# Patient Record
Sex: Male | Born: 1970
Health system: Southern US, Community
[De-identification: ages and names within clinical notes are randomized; demographics above are authoritative.]

## PROBLEM LIST (undated history)

## (undated) HISTORY — PX: HERNIA REPAIR: SHX51

---

## 2010-03-06 ENCOUNTER — Emergency Department: Payer: Self-pay | Admitting: Emergency Medicine

## 2011-07-07 ENCOUNTER — Emergency Department: Payer: Self-pay | Admitting: Emergency Medicine

## 2011-07-07 LAB — CBC
HGB: 15.2 g/dL (ref 13.0–18.0)
MCH: 30.1 pg (ref 26.0–34.0)
MCHC: 35.1 g/dL (ref 32.0–36.0)
MCV: 86 fL (ref 80–100)
Platelet: 230 10*3/uL (ref 150–440)
RBC: 5.05 10*6/uL (ref 4.40–5.90)
WBC: 7.8 10*3/uL (ref 3.8–10.6)

## 2011-07-07 LAB — COMPREHENSIVE METABOLIC PANEL
Albumin: 4.2 g/dL (ref 3.4–5.0)
Anion Gap: 12 (ref 7–16)
Calcium, Total: 8.9 mg/dL (ref 8.5–10.1)
Chloride: 104 mmol/L (ref 98–107)
Creatinine: 0.87 mg/dL (ref 0.60–1.30)
EGFR (African American): >60
Glucose: 123 mg/dL — ABNORMAL HIGH (ref 65–99)
Potassium: 3.5 mmol/L (ref 3.5–5.1)
SGOT(AST): 22 U/L (ref 15–37)

## 2011-07-07 LAB — PROTIME-INR: INR: 0.9

## 2012-11-15 ENCOUNTER — Emergency Department: Payer: Self-pay | Admitting: Internal Medicine

## 2012-11-15 LAB — URINALYSIS, COMPLETE
Bacteria: NONE SEEN
Ketone: NEGATIVE
RBC,UR: 156 /HPF (ref 0–5)
Specific Gravity: 1.019 (ref 1.003–1.030)
Squamous Epithelial: <1
WBC UR: 9 /HPF (ref 0–5)

## 2012-11-15 LAB — COMPREHENSIVE METABOLIC PANEL
Albumin: 4.1 g/dL (ref 3.4–5.0)
Anion Gap: 7 (ref 7–16)
BUN: 16 mg/dL (ref 7–18)
Calcium, Total: 9.1 mg/dL (ref 8.5–10.1)
Chloride: 104 mmol/L (ref 98–107)
Co2: 27 mmol/L (ref 21–32)
Creatinine: 0.95 mg/dL (ref 0.60–1.30)
EGFR (African American): >60
EGFR (Non-African Amer.): >60
Glucose: 101 mg/dL — ABNORMAL HIGH (ref 65–99)
Osmolality: 277 (ref 275–301)
Potassium: 3.5 mmol/L (ref 3.5–5.1)
SGPT (ALT): 25 U/L (ref 12–78)
Sodium: 138 mmol/L (ref 136–145)
Total Protein: 7.6 g/dL (ref 6.4–8.2)

## 2012-11-15 LAB — CBC
HCT: 45.4 % (ref 40.0–52.0)
HGB: 16.2 g/dL (ref 13.0–18.0)
MCH: 29.9 pg (ref 26.0–34.0)
MCV: 84 fL (ref 80–100)
Platelet: 230 10*3/uL (ref 150–440)
RBC: 5.42 10*6/uL (ref 4.40–5.90)

## 2013-10-22 IMAGING — CT CT STONE STUDY
1 of 2 series · 16 of 32 positions shown, 20 images · non-contrast
Comparison: none

REASON FOR EXAM: flank pain l
COMMENTS:

PROCEDURE:     CT  - CT ABDOMEN /PELVIS WO (STONE)  - November 15, 2012  [DATE]
RESULT:     History: Flank pain.
Comparison Study: No prior.

[Series 2: 3mm soft tissue · axial · 0.68mm/px · z∈[-906,-450]mm · 16 of 166 slices shown, 20 images]
[im 7/166  soft-tissue]
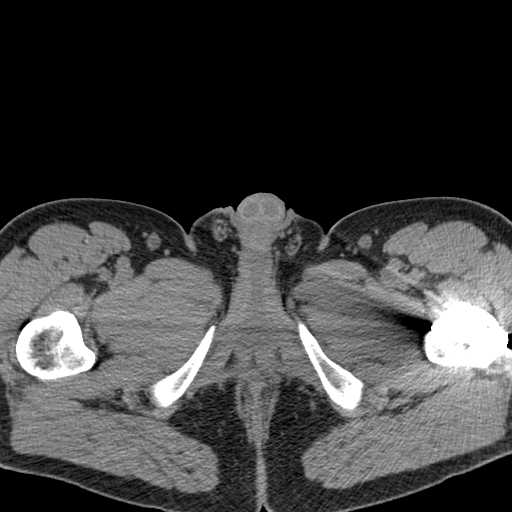
[im 7/166  bone]
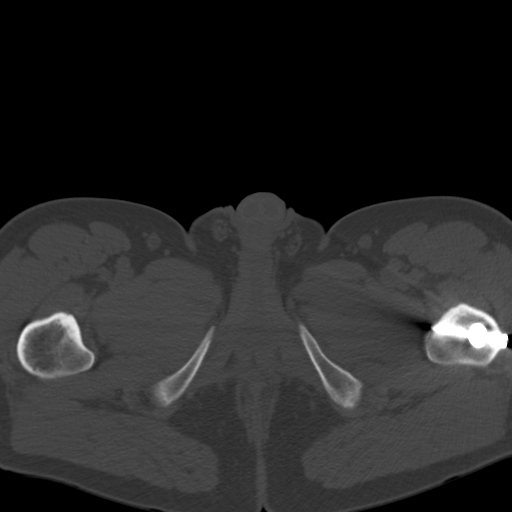
[im 20/166  soft-tissue]
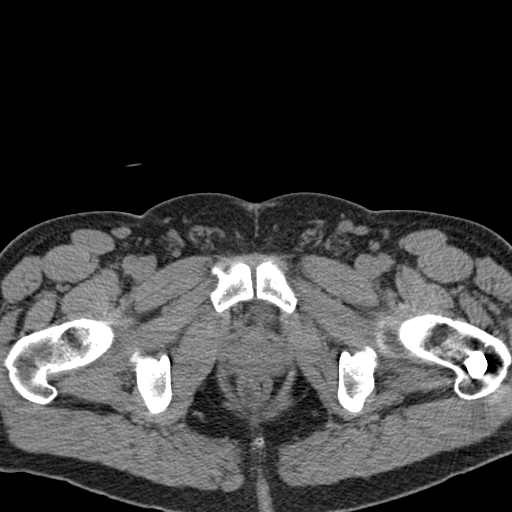
[im 34/166  soft-tissue]
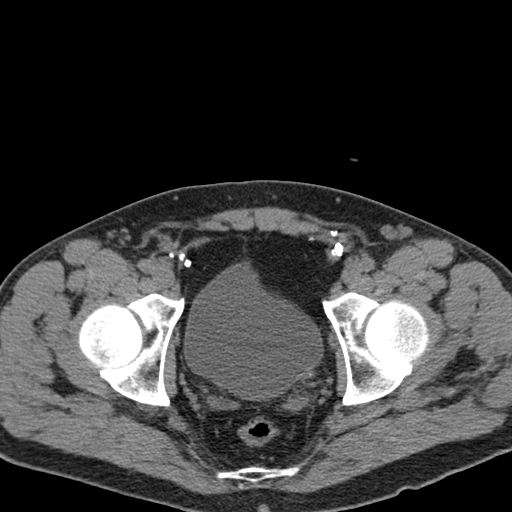
[im 47/166  soft-tissue]
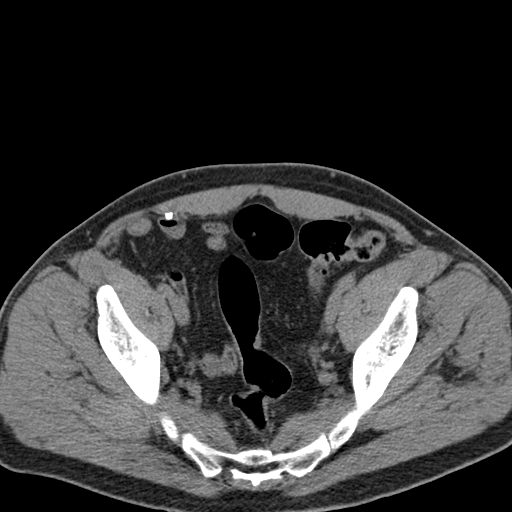
[im 53/166  soft-tissue]
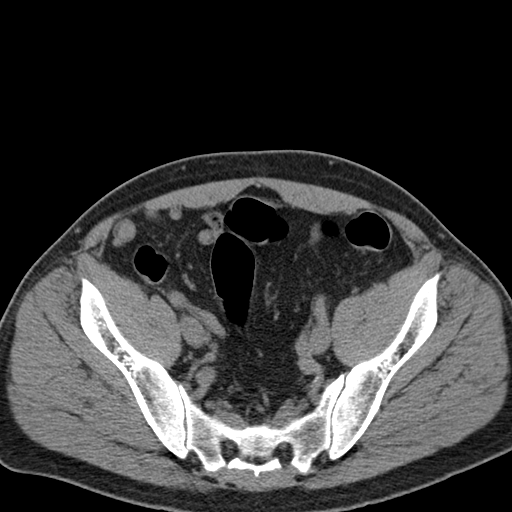
[im 67/166  soft-tissue]
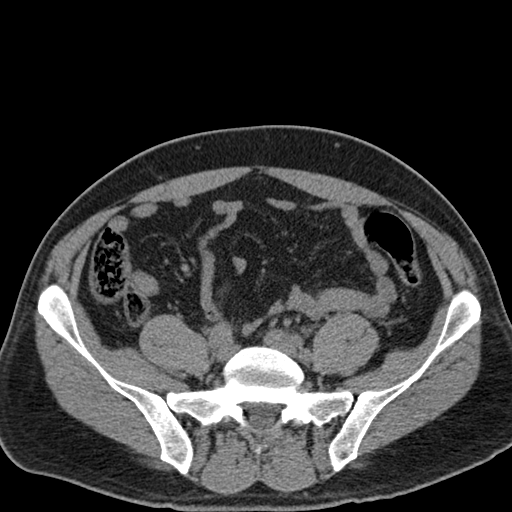
[im 80/166  soft-tissue]
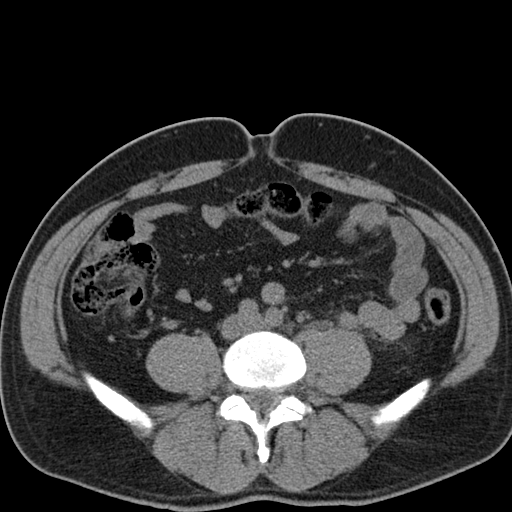
[im 86/166  soft-tissue]
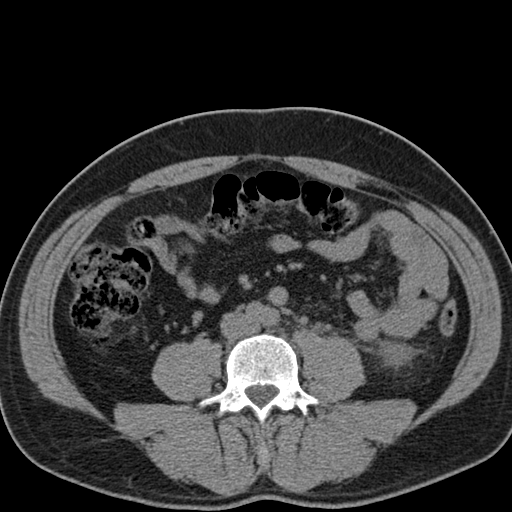
[im 100/166  soft-tissue]
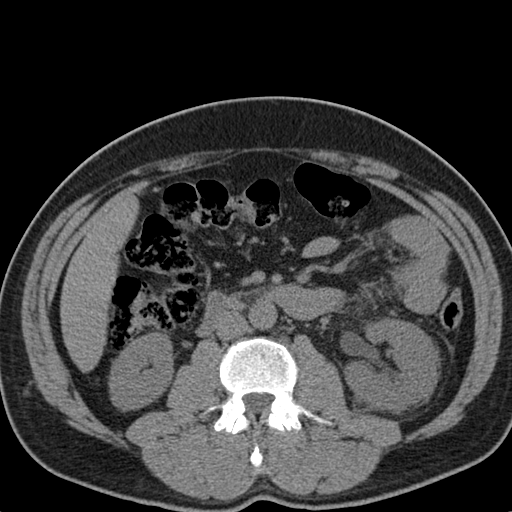
[im 100/166  bone]
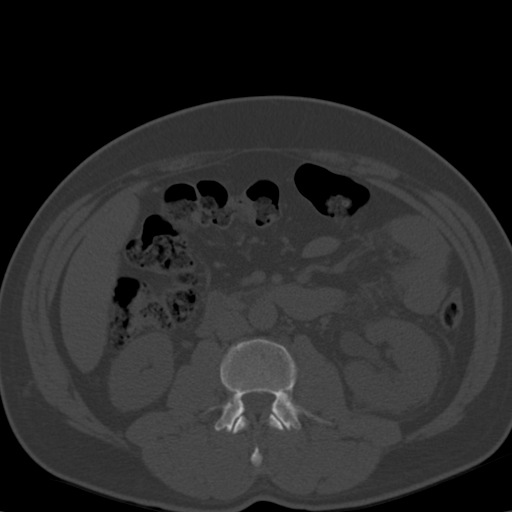
[im 113/166  soft-tissue]
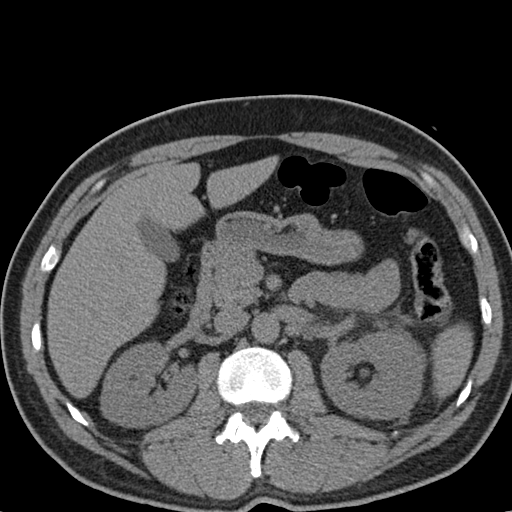
[im 126/166  soft-tissue]
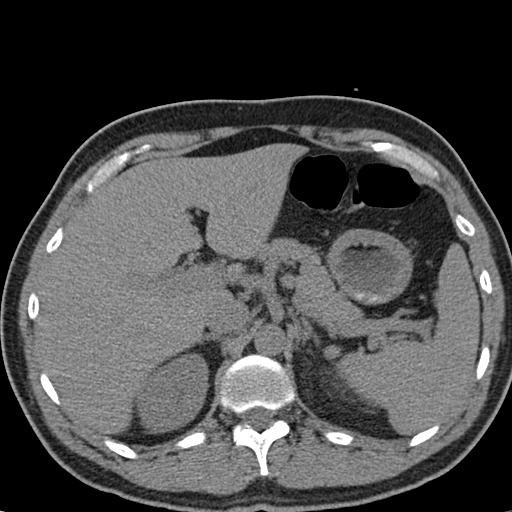
[im 133/166  soft-tissue]
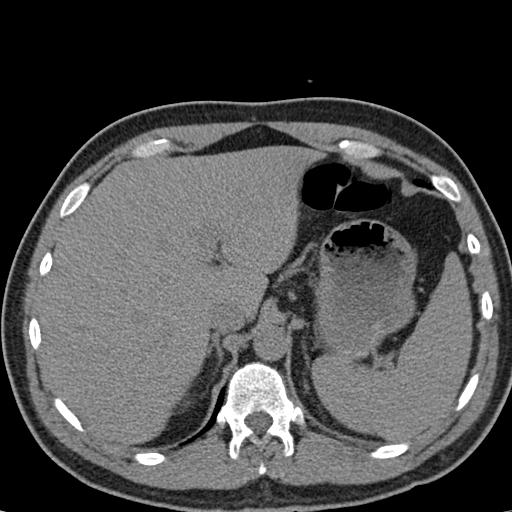
[im 139/166  lung]
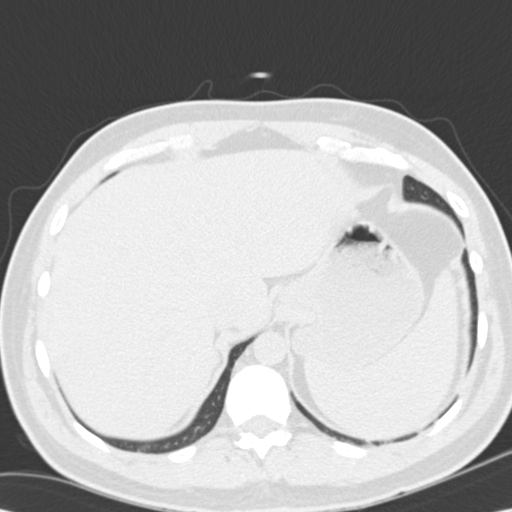
[im 146/166  soft-tissue]
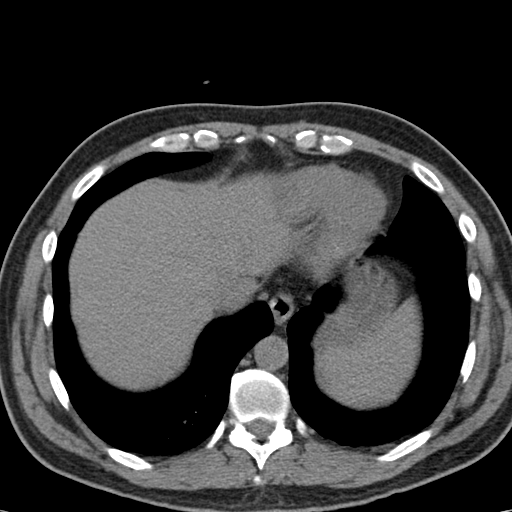
[im 146/166  lung]
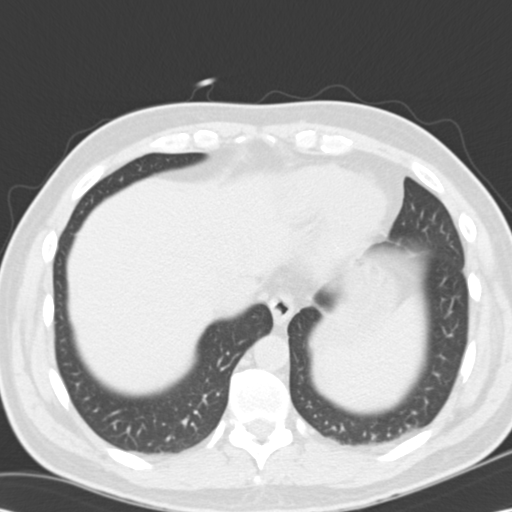
[im 152/166  lung]
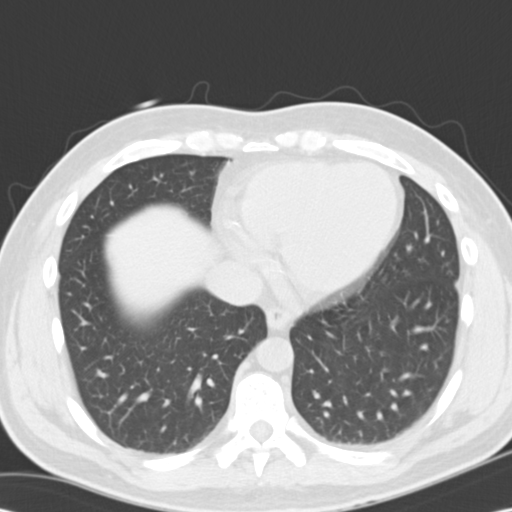
[im 159/166  soft-tissue]
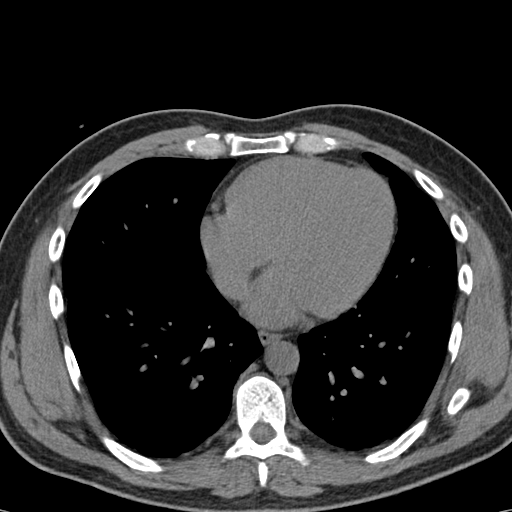
[im 159/166  lung]
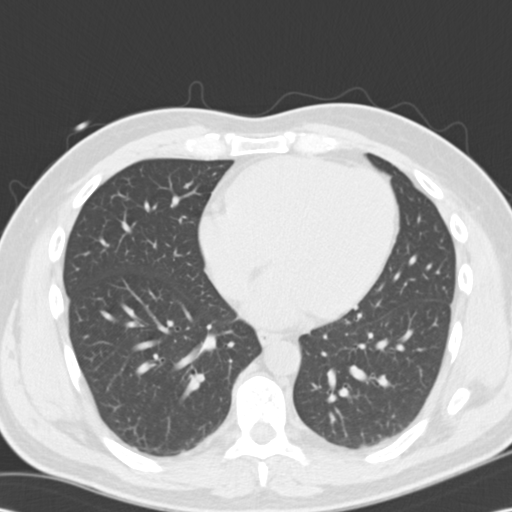

[16 of 32 positions shown; findings below may reference images not displayed]

FINDINGS: Standard nonenhanced CT obtained. Lung bases clear. Simple cyst
left lobe of liver. Gallbladder is nondistended. No biliary ductal
distention. Pancreas is normal. Spleen is normal. Adrenals are normal. Right
kidney and ureter are normal. 2-3 mm stone is present in the distal left
ureter with mild left hydronephrosis and hydroureter with mild left
perirenal streaking. Left nephrolithiasis present. Bladder is nondistended.
No pelvic mass. No pelvic fluid collections. No bowel distention. Appendix
normal. Prior hernia repair. No free air. Postsurgical changes left hip.
IMPRESSION: 2 to 3 mm stone in the distal left ureter at the
ureterovesical junction with mild hydronephrosis. Left nephrolithiasis.

## 2015-06-21 ENCOUNTER — Emergency Department
Admission: EM | Admit: 2015-06-21 | Discharge: 2015-06-21 | Disposition: A | Discharge status: Home / Self Care | Payer: 59 | Attending: Emergency Medicine | Admitting: Emergency Medicine

## 2015-06-21 ENCOUNTER — Emergency Department: Payer: 59

## 2015-06-21 DIAGNOSIS — G43809 Other migraine, not intractable, without status migrainosus: Secondary | ICD-10-CM | POA: Diagnosis not present

## 2015-06-21 DIAGNOSIS — R51 Headache: Secondary | ICD-10-CM | POA: Diagnosis present

## 2015-06-21 LAB — COMPREHENSIVE METABOLIC PANEL
ALBUMIN: 4.4 g/dL (ref 3.5–5.0)
ALT: 33 U/L (ref 17–63)
ANION GAP: 7 (ref 5–15)
AST: 22 U/L (ref 15–41)
Alkaline Phosphatase: 73 U/L (ref 38–126)
BILIRUBIN TOTAL: 1.1 mg/dL (ref 0.3–1.2)
BUN: 13 mg/dL (ref 6–20)
CALCIUM: 9.4 mg/dL (ref 8.9–10.3)
CO2: 27 mmol/L (ref 22–32)
Chloride: 103 mmol/L (ref 101–111)
Creatinine, Ser: 0.6 mg/dL — ABNORMAL LOW (ref 0.61–1.24)
GFR calc non Af Amer: >60 mL/min (ref >60)
GLUCOSE: 101 mg/dL — AB (ref 65–99)
POTASSIUM: 3.9 mmol/L (ref 3.5–5.1)
SODIUM: 137 mmol/L (ref 135–145)
TOTAL PROTEIN: 7.7 g/dL (ref 6.5–8.1)

## 2015-06-21 LAB — CBC WITH DIFFERENTIAL/PLATELET
BASOS ABS: 0 10*3/uL (ref 0–0.1)
Eosinophils Absolute: 0.1 10*3/uL (ref 0–0.7)
Eosinophils Relative: 2 %
HEMATOCRIT: 47 % (ref 40.0–52.0)
HEMOGLOBIN: 16.5 g/dL (ref 13.0–18.0)
Lymphs Abs: 1.3 10*3/uL (ref 1.0–3.6)
MCH: 29.4 pg (ref 26.0–34.0)
MCHC: 35.2 g/dL (ref 32.0–36.0)
MCV: 83.8 fL (ref 80.0–100.0)
MONO ABS: 0.9 10*3/uL (ref 0.2–1.0)
Monocytes Relative: 14 %
NEUTROS ABS: 4.1 10*3/uL (ref 1.4–6.5)
Platelets: 207 10*3/uL (ref 150–440)
RBC: 5.61 MIL/uL (ref 4.40–5.90)
RDW: 13.6 % (ref 11.5–14.5)
WBC: 6.4 10*3/uL (ref 3.8–10.6)

## 2015-06-21 MED ORDER — KETOROLAC TROMETHAMINE 30 MG/ML IJ SOLN
30.0000 mg | Freq: Once | INTRAMUSCULAR | Status: AC
  Administered 2015-06-21: 30 mg via INTRAVENOUS

## 2015-06-21 MED ORDER — PROCHLORPERAZINE EDISYLATE 5 MG/ML IJ SOLN
10.0000 mg | Freq: Four times a day (QID) | INTRAMUSCULAR | Status: DC | PRN
  Administered 2015-06-21: 10 mg via INTRAVENOUS
  Filled 2015-06-21: qty 2

## 2015-06-21 MED ORDER — KETOROLAC TROMETHAMINE 30 MG/ML IJ SOLN
INTRAMUSCULAR | Status: AC
  Filled 2015-06-21: qty 1

## 2015-06-21 MED ORDER — SODIUM CHLORIDE 0.9 % IV SOLN
Freq: Once | INTRAVENOUS | Status: AC
  Administered 2015-06-21: 12:00:00 via INTRAVENOUS

## 2015-06-21 NOTE — ED Notes (Signed)
Pt c/o HA since Tuesday with dizziness and nausea.. States he has had HA in the past but never this severe..Marland Kitchen

## 2015-06-21 NOTE — ED Notes (Signed)
Pt states has a history of migraine headaches. Denies visual changes. Pt reports nausea. Pt states headache is constant but comes and goes in different locations on his head.

## 2015-06-21 NOTE — Discharge Instructions (Signed)
Migraine Headache A migraine headache is an intense, throbbing pain on one or both sides of your head. A migraine can last for 30 minutes to several hours. CAUSES  The exact cause of a migraine headache is not always known. However, a migraine may be caused when nerves in the brain become irritated and release chemicals that cause inflammation. This causes pain. Certain things may also trigger migraines, such as:  Alcohol.  Smoking.  Stress.  Menstruation.  Aged cheeses.  Foods or drinks that contain nitrates, glutamate, aspartame, or tyramine.  Lack of sleep.  Chocolate.  Caffeine.  Hunger.  Physical exertion.  Fatigue.  Medicines used to treat chest pain (nitroglycerine), birth control pills, estrogen, and some blood pressure medicines. SIGNS AND SYMPTOMS  Pain on one or both sides of your head.  Pulsating or throbbing pain.  Severe pain that prevents daily activities.  Pain that is aggravated by any physical activity.  Nausea, vomiting, or both.  Dizziness.  Pain with exposure to bright lights, loud noises, or activity.  General sensitivity to bright lights, loud noises, or smells. Before you get a migraine, you may get warning signs that a migraine is coming (aura). An aura may include:  Seeing flashing lights.  Seeing bright spots, halos, or zigzag lines.  Having tunnel vision or blurred vision.  Having feelings of numbness or tingling.  Having trouble talking.  Having muscle weakness. DIAGNOSIS  A migraine headache is often diagnosed based on:  Symptoms.  Physical exam.  A CT scan or MRI of your head. These imaging tests cannot diagnose migraines, but they can help rule out other causes of headaches. TREATMENT Medicines may be given for pain and nausea. Medicines can also be given to help prevent recurrent migraines.  HOME CARE INSTRUCTIONS  Only take over-the-counter or prescription medicines for pain or discomfort as directed by your  health care provider. The use of long-term narcotics is not recommended.  Lie down in a dark, quiet room when you have a migraine.  Keep a journal to find out what may trigger your migraine headaches. For example, write down:  What you eat and drink.  How much sleep you get.  Any change to your diet or medicines.  Limit alcohol consumption.  Quit smoking if you smoke.  Get 7-9 hours of sleep, or as recommended by your health care provider.  Limit stress.  Keep lights dim if bright lights bother you and make your migraines worse. SEEK IMMEDIATE MEDICAL CARE IF:   Your migraine becomes severe.  You have a fever.  You have a stiff neck.  You have vision loss.  You have muscular weakness or loss of muscle control.  You start losing your balance or have trouble walking.  You feel faint or pass out.  You have severe symptoms that are different from your first symptoms. MAKE SURE YOU:   Understand these instructions.  Will watch your condition.  Will get help right away if you are not doing well or get worse.   This information is not intended to replace advice given to you by your health care provider. Make sure you discuss any questions you have with your health care provider.   Document Released: 06/09/2005 Document Revised: 06/30/2014 Document Reviewed: 02/14/2013 Elsevier Interactive Patient Education 2016 [employer].  Rest and take it easy today. Drink plenty of fluids. If the headache starts to come back take some high-strength Motrin 800 mg. This is the same as for the over-the-counter pills. And take  3 regular Tylenols and drink a can of Coca-Cola or a vague cup of coffee. That does not work please return again. Please return also for fever vomiting or feeling sicker.

## 2015-06-21 NOTE — ED Provider Notes (Signed)
[name] Emergency Department Provider Note  ____________________________________________  Time seen: Approximately 11:17 AM  I have reviewed the triage vital signs and the nursing notes.   HISTORY  Chief Complaint Headache    HPI Brian OchsMichael T Acosta is a 44 y.o. male patient reported he developed some abdominal pain 2 days ago. This then turned into a headache. He's had a headache ever since. Throbbing, diffuse in nature he moves his head to lay down and it'll hurt where he lays his head down his head in a different position and hurt there to after a while. Patient is nauseated but has no vomiting. Patient denies any fever. Patient does not have neck stiffness. Patient reports he's had migraines in the past but none for about 2 years. This headache feels like his migraine but is more intense and also has lasted longer.   History reviewed. No pertinent past medical history.  There are no active problems to display for this patient.   Past Surgical History  Procedure Laterality Date  . Hernia repair      No current outpatient prescriptions on file.  Allergies Review of patient's allergies indicates no known allergies.  No family history on file.  Social History Social History  Substance Use Topics  . Smoking status: Never Smoker   . Smokeless tobacco: None  . Alcohol Use: No    Review of Systems onstitutional: No fever/chills Eyes: No visual changes. ENT: No sore throat. Cardiovascular: Denies chest pain. Respiratory: Denies shortness of breath. Gastrointestinal: No abdominal pain.   nausea, no vomiting.  No diarrhea.  No constipation. Genitourinary: Negative for dysuria. Musculoskeletal: Negative for back pain. Skin: Negative for rash. Neurological: Negative for focal weakness or numbness.  10-point ROS otherwise negative.  ____________________________________________   PHYSICAL EXAM:  VITAL SIGNS: ED Triage Vitals  Enc Vitals  Group     BP 06/21/15 1021 143/90 mmHg     Pulse Rate 06/21/15 1021 68     Resp 06/21/15 1021 18     Temp 06/21/15 1021 98.2 F (36.8 C)     Temp Source 06/21/15 1021 Oral     SpO2 06/21/15 1021 96 %     Weight 06/21/15 1021 220 lb (99.791 kg)     Height 06/21/15 1021 6' (1.829 m)     Head Cir --      Peak Flow --      Pain Score 06/21/15 1021 8     Pain Loc --      Pain Edu? --      Excl. in GC? --     Constitutional: Alert and oriented. Looks uncomfortable Eyes: Conjunctivae are normal. PERRL. EOMI. fundi appear to be normal Head: Atraumatic. Nose: No congestion/rhinnorhea. Mouth/Throat: Mucous membranes are moist.  Oropharynx non-erythematous. Neck: No stridor.   Cardiovascular: Normal rate, regular rhythm. Grossly normal heart sounds.  Good peripheral circulation. Respiratory: Normal respiratory effort.  No retractions. Lungs CTAB. Gastrointestinal: Soft and nontender. No distention. No abdominal bruits. No CVA tenderness. Musculoskeletal: No lower extremity tenderness nor edema.  No joint effusions. Neurologic:  Normal speech and language. No gross focal neurologic deficits are appreciated. Cranial nerves II through XII are intact. Finger-nose rapid alternating movements and hands are normal motor strength is 5 over 5 throughout there are no sensory changes. Skin:  Skin is warm, dry and intact. No rash noted. Psychiatric: Mood and affect are normal. Speech and behavior are normal.  ____________________________________________   LABS (all labs ordered are listed, but only  abnormal results are displayed)  Labs Reviewed  COMPREHENSIVE METABOLIC PANEL - Abnormal; Notable for the following:    Glucose, Bld 101 (*)    Creatinine, Ser 0.60 (*)    All other components within normal limits  CBC WITH DIFFERENTIAL/PLATELET   ____________________________________________  EKG   ____________________________________________  RADIOLOGY  CT read by radiology as no acute  disease ____________________________________________   PROCEDURES   Patient will be discharged as he has improved ____________________________________________   INITIAL IMPRESSION / ASSESSMENT AND PLAN / ED COURSE  Pertinent labs & imaging results that were available during my care of the patient were reviewed by me and considered in my medical decision making (see chart for details).   ____________________________________________   FINAL CLINICAL IMPRESSION(S) / ED DIAGNOSES  Final diagnoses:  Other type of migraine      [name], MD 06/21/15 1347

## 2015-08-22 DIAGNOSIS — E7800 Pure hypercholesterolemia, unspecified: Secondary | ICD-10-CM | POA: Diagnosis not present

## 2015-08-22 DIAGNOSIS — I10 Essential (primary) hypertension: Secondary | ICD-10-CM | POA: Diagnosis not present

## 2015-08-22 DIAGNOSIS — R6882 Decreased libido: Secondary | ICD-10-CM | POA: Diagnosis not present

## 2015-08-22 DIAGNOSIS — Z6829 Body mass index (BMI) 29.0-29.9, adult: Secondary | ICD-10-CM | POA: Diagnosis not present

## 2015-08-22 DIAGNOSIS — N529 Male erectile dysfunction, unspecified: Secondary | ICD-10-CM | POA: Diagnosis not present

## 2015-08-22 DIAGNOSIS — R6889 Other general symptoms and signs: Secondary | ICD-10-CM | POA: Diagnosis not present

## 2015-08-22 DIAGNOSIS — Z0000 Encounter for general adult medical examination without abnormal findings: Secondary | ICD-10-CM | POA: Diagnosis not present

## 2015-09-25 DIAGNOSIS — E291 Testicular hypofunction: Secondary | ICD-10-CM | POA: Diagnosis not present

## 2015-09-25 DIAGNOSIS — Z23 Encounter for immunization: Secondary | ICD-10-CM | POA: Diagnosis not present

## 2016-05-27 IMAGING — CT CT HEAD W/O CM
1 series · 16 of 30 positions shown, 20 images · non-contrast
Comparison: No priors.

CLINICAL DATA: 44-year-old male with history of severe headache,
dizziness and nausea for the past 3 days.

EXAM:
CT HEAD WITHOUT CONTRAST
TECHNIQUE: Contiguous axial images were obtained from the base of the skull
through the vertex without intravenous contrast.

[Series 2: head wo · axial · 0.47mm/px · z∈[+299,+456]mm · 16 of 36 slices shown, 20 images]
[im 2/36  brain]
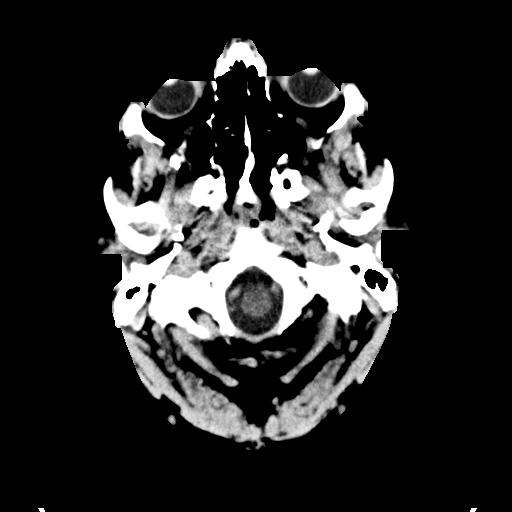
[im 2/36  bone]
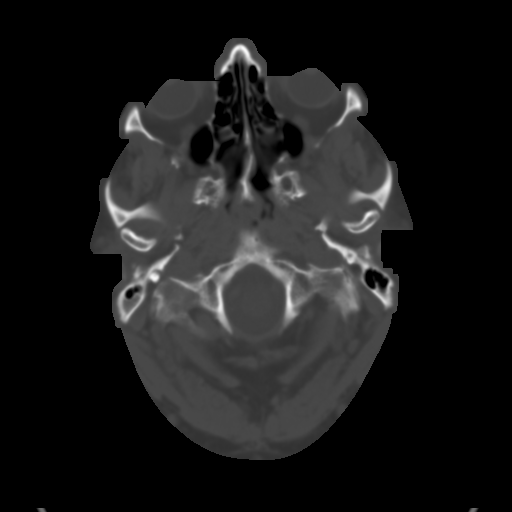
[im 4/36  brain]
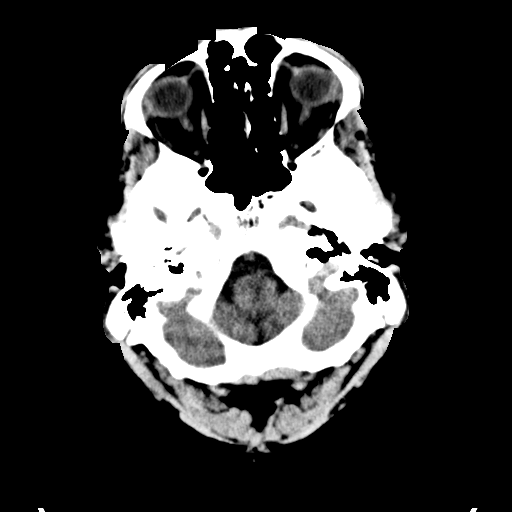
[im 7/36  brain]
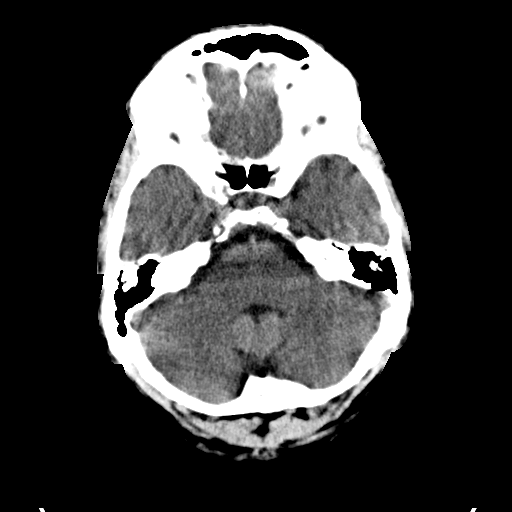
[im 9/36  brain]
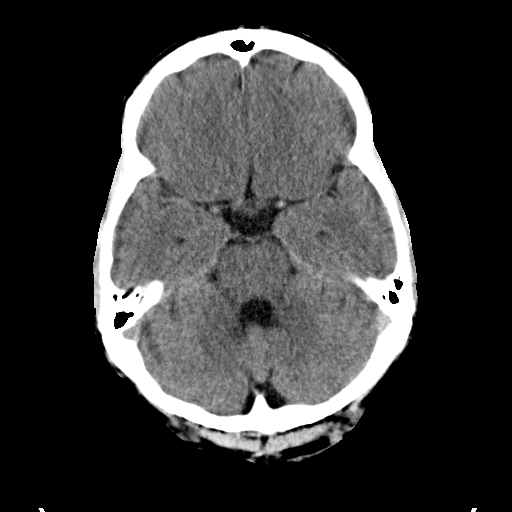
[im 10/36  brain]
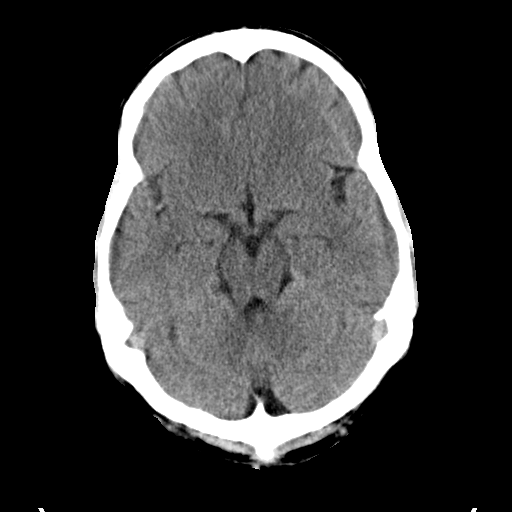
[im 10/36  bone]
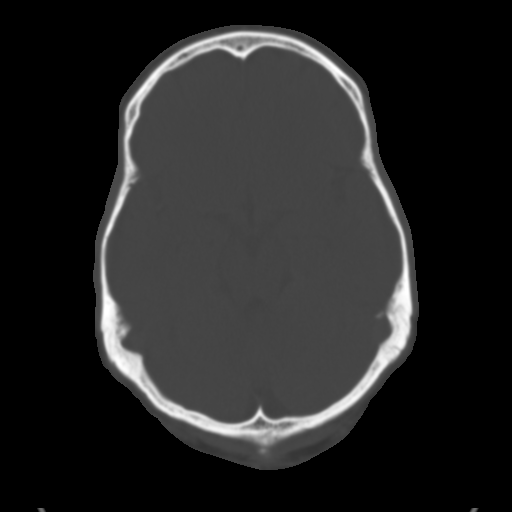
[im 13/36  brain]
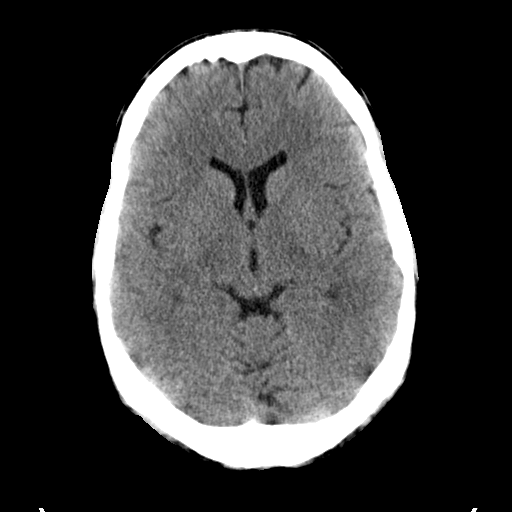
[im 15/36  brain]
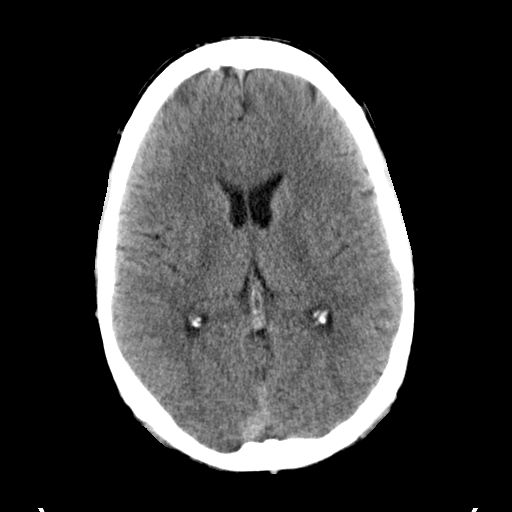
[im 17/36  brain]
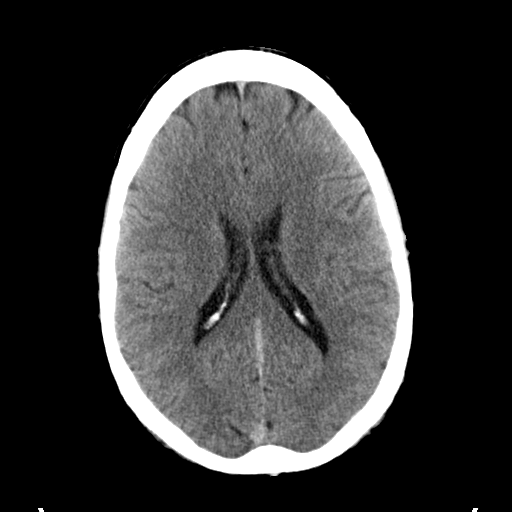
[im 19/36  brain]
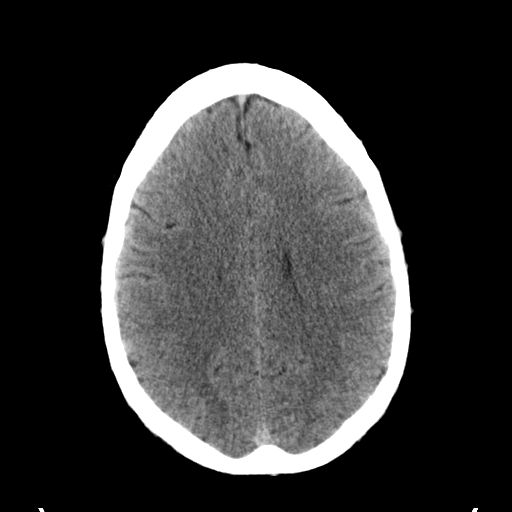
[im 19/36  bone]
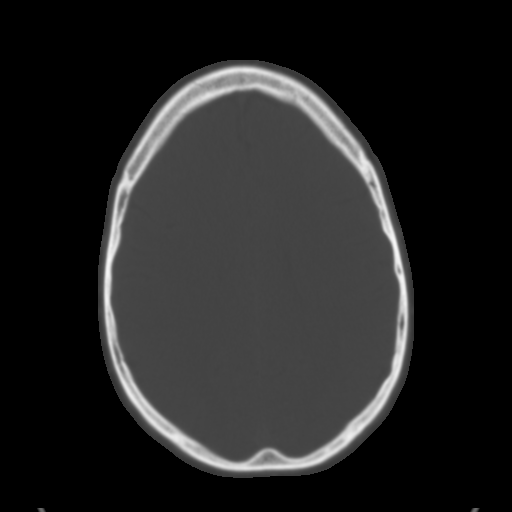
[im 21/36  brain]
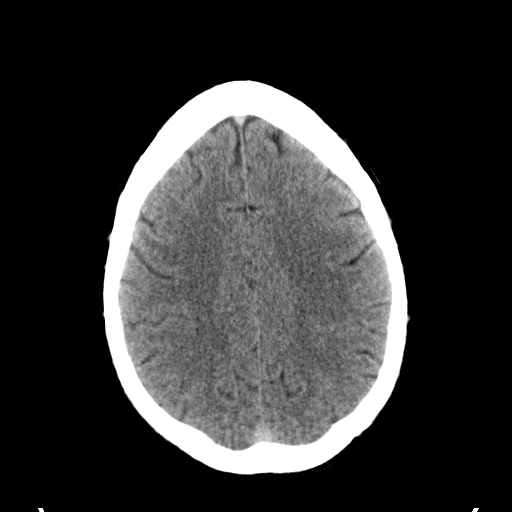
[im 23/36  brain]
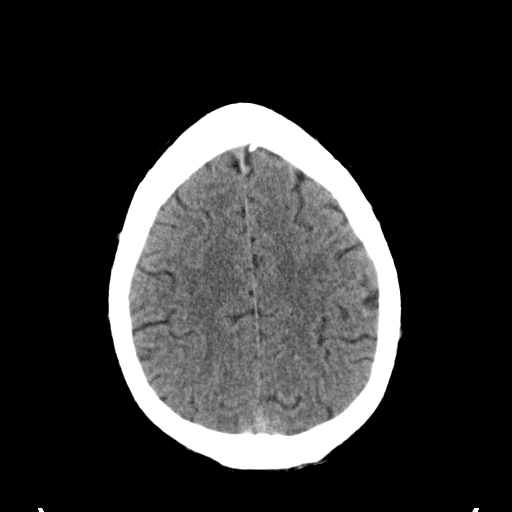
[im 26/36  brain]
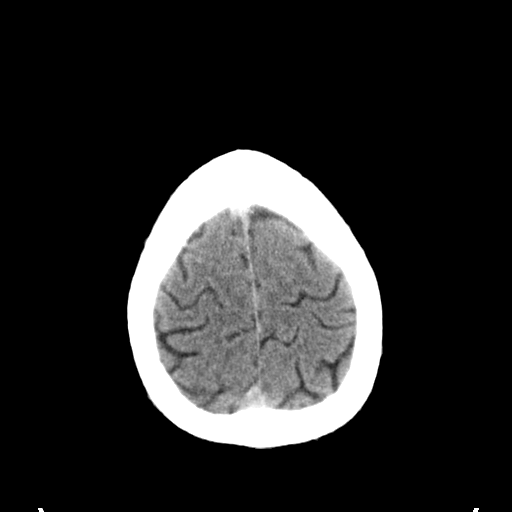
[im 27/36  brain]
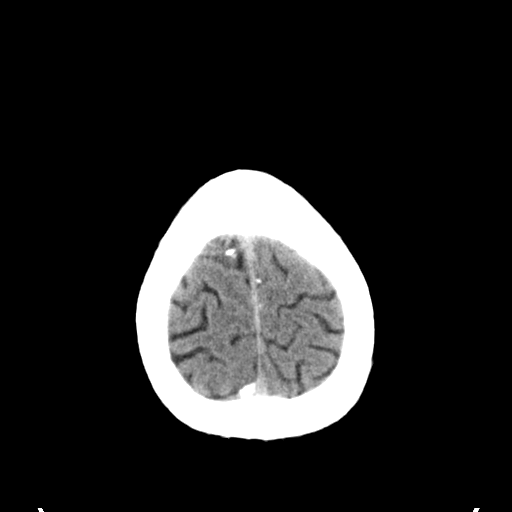
[im 27/36  bone]
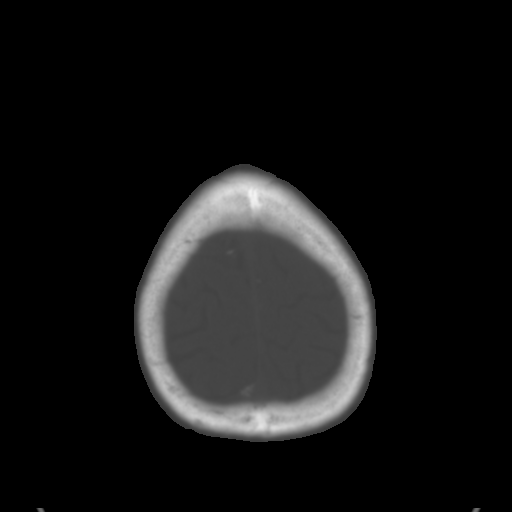
[im 29/36  brain]
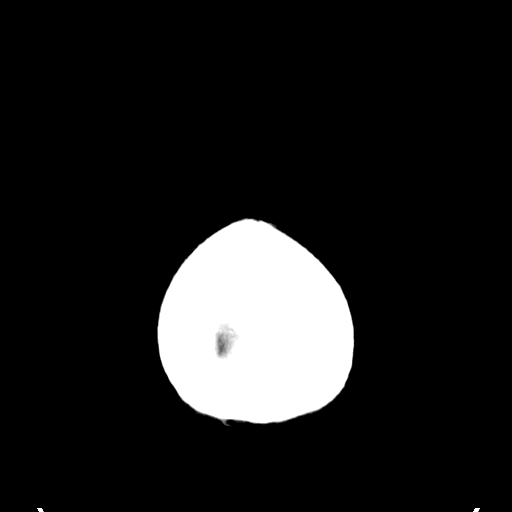
[im 32/36  brain]
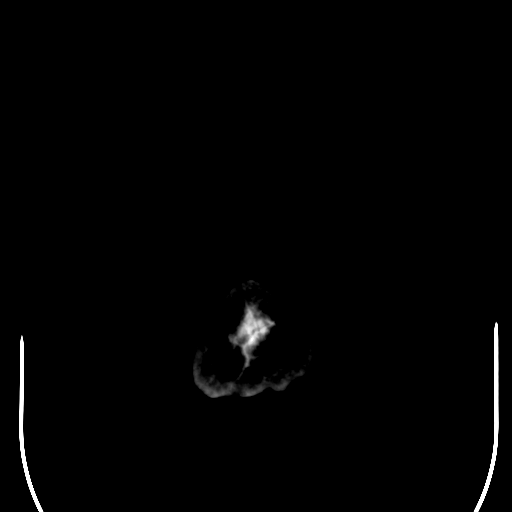
[im 34/36  brain]
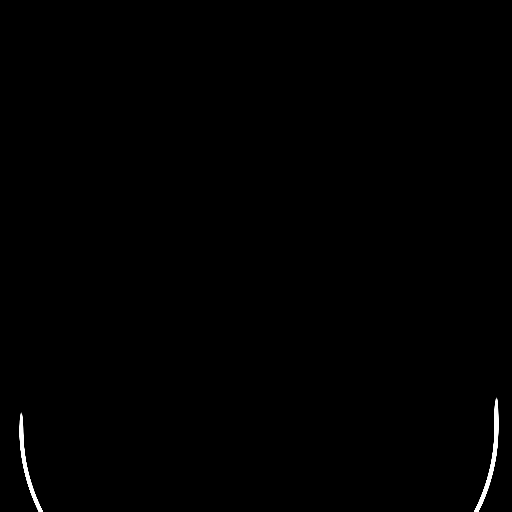

[16 of 30 positions shown; findings below may reference images not displayed]

FINDINGS: No acute intracranial abnormalities. Specifically, no evidence of
acute intracranial hemorrhage, no definite findings of
acute/subacute cerebral ischemia, no mass, mass effect,
hydrocephalus or abnormal intra or extra-axial fluid collections.
Visualized paranasal sinuses and mastoids are well pneumatized. No
acute displaced skull fractures are identified.
IMPRESSION: *No acute intracranial abnormalities.
*The appearance of the brain is normal.

## 2016-07-23 DIAGNOSIS — J019 Acute sinusitis, unspecified: Secondary | ICD-10-CM | POA: Diagnosis not present

## 2016-07-30 DIAGNOSIS — J111 Influenza due to unidentified influenza virus with other respiratory manifestations: Secondary | ICD-10-CM | POA: Diagnosis not present

## 2016-07-30 DIAGNOSIS — R5383 Other fatigue: Secondary | ICD-10-CM | POA: Diagnosis not present

## 2017-03-20 DIAGNOSIS — Z23 Encounter for immunization: Secondary | ICD-10-CM | POA: Diagnosis not present

## 2017-03-20 DIAGNOSIS — E663 Overweight: Secondary | ICD-10-CM | POA: Diagnosis not present

## 2017-03-20 DIAGNOSIS — B351 Tinea unguium: Secondary | ICD-10-CM | POA: Diagnosis not present

## 2017-03-20 DIAGNOSIS — N529 Male erectile dysfunction, unspecified: Secondary | ICD-10-CM | POA: Diagnosis not present

## 2017-03-20 DIAGNOSIS — Z6829 Body mass index (BMI) 29.0-29.9, adult: Secondary | ICD-10-CM | POA: Diagnosis not present

## 2017-03-20 DIAGNOSIS — Z0000 Encounter for general adult medical examination without abnormal findings: Secondary | ICD-10-CM | POA: Diagnosis not present

## 2017-03-20 DIAGNOSIS — Z1389 Encounter for screening for other disorder: Secondary | ICD-10-CM | POA: Diagnosis not present

## 2017-03-20 DIAGNOSIS — E349 Endocrine disorder, unspecified: Secondary | ICD-10-CM | POA: Diagnosis not present

## 2017-12-31 DIAGNOSIS — H6123 Impacted cerumen, bilateral: Secondary | ICD-10-CM | POA: Diagnosis not present

## 2017-12-31 DIAGNOSIS — Z6829 Body mass index (BMI) 29.0-29.9, adult: Secondary | ICD-10-CM | POA: Diagnosis not present

## 2018-02-01 DIAGNOSIS — H524 Presbyopia: Secondary | ICD-10-CM | POA: Diagnosis not present

## 2018-03-22 DIAGNOSIS — B351 Tinea unguium: Secondary | ICD-10-CM | POA: Diagnosis not present

## 2018-03-22 DIAGNOSIS — Z0000 Encounter for general adult medical examination without abnormal findings: Secondary | ICD-10-CM | POA: Diagnosis not present

## 2018-03-22 DIAGNOSIS — Z23 Encounter for immunization: Secondary | ICD-10-CM | POA: Diagnosis not present

## 2018-03-22 DIAGNOSIS — Z6829 Body mass index (BMI) 29.0-29.9, adult: Secondary | ICD-10-CM | POA: Diagnosis not present

## 2018-06-14 DIAGNOSIS — M25561 Pain in right knee: Secondary | ICD-10-CM | POA: Diagnosis not present

## 2018-06-14 DIAGNOSIS — R5383 Other fatigue: Secondary | ICD-10-CM | POA: Diagnosis not present

## 2018-06-14 DIAGNOSIS — M2550 Pain in unspecified joint: Secondary | ICD-10-CM | POA: Diagnosis not present

## 2018-06-18 DIAGNOSIS — M25562 Pain in left knee: Secondary | ICD-10-CM | POA: Diagnosis not present

## 2018-06-18 DIAGNOSIS — G8929 Other chronic pain: Secondary | ICD-10-CM | POA: Diagnosis not present

## 2018-06-18 DIAGNOSIS — M25561 Pain in right knee: Secondary | ICD-10-CM | POA: Diagnosis not present

## 2018-06-18 DIAGNOSIS — M170 Bilateral primary osteoarthritis of knee: Secondary | ICD-10-CM | POA: Diagnosis not present

## 2020-04-16 ENCOUNTER — Other Ambulatory Visit: Payer: Self-pay | Admitting: Physician Assistant

## 2020-04-16 DIAGNOSIS — Z6828 Body mass index (BMI) 28.0-28.9, adult: Secondary | ICD-10-CM | POA: Diagnosis not present

## 2020-04-16 DIAGNOSIS — N529 Male erectile dysfunction, unspecified: Secondary | ICD-10-CM | POA: Diagnosis not present

## 2020-04-16 DIAGNOSIS — I10 Essential (primary) hypertension: Secondary | ICD-10-CM | POA: Diagnosis not present

## 2020-04-16 DIAGNOSIS — Z125 Encounter for screening for malignant neoplasm of prostate: Secondary | ICD-10-CM | POA: Diagnosis not present

## 2020-04-16 DIAGNOSIS — Z1331 Encounter for screening for depression: Secondary | ICD-10-CM | POA: Diagnosis not present

## 2020-09-17 ENCOUNTER — Other Ambulatory Visit: Payer: Self-pay | Admitting: Physician Assistant

## 2020-10-09 ENCOUNTER — Other Ambulatory Visit: Payer: Self-pay

## 2020-10-09 DIAGNOSIS — Z6828 Body mass index (BMI) 28.0-28.9, adult: Secondary | ICD-10-CM | POA: Diagnosis not present

## 2020-10-09 DIAGNOSIS — N529 Male erectile dysfunction, unspecified: Secondary | ICD-10-CM | POA: Diagnosis not present

## 2020-10-09 DIAGNOSIS — I10 Essential (primary) hypertension: Secondary | ICD-10-CM | POA: Diagnosis not present

## 2020-10-09 DIAGNOSIS — E7800 Pure hypercholesterolemia, unspecified: Secondary | ICD-10-CM | POA: Diagnosis not present

## 2020-10-09 DIAGNOSIS — K219 Gastro-esophageal reflux disease without esophagitis: Secondary | ICD-10-CM | POA: Diagnosis not present

## 2020-10-09 MED ORDER — OMEPRAZOLE 40 MG PO CPDR
DELAYED_RELEASE_CAPSULE | ORAL | 2 refills | Status: DC
  Filled 2020-10-09: qty 90, 90d supply, fill #0
  Filled 2021-03-01: qty 90, 90d supply, fill #1
  Filled 2021-07-11: qty 90, 90d supply, fill #2

## 2020-10-09 MED ORDER — LOSARTAN POTASSIUM 50 MG PO TABS
ORAL_TABLET | ORAL | 2 refills | Status: AC
  Filled 2020-10-09 – 2020-10-26 (×2): qty 90, 90d supply, fill #0
  Filled 2021-02-05: qty 90, 90d supply, fill #1

## 2020-10-11 MED FILL — Sildenafil Citrate Tab 50 MG: ORAL | 30 days supply | Qty: 6 | Fill #0 | Status: AC

## 2020-10-12 ENCOUNTER — Other Ambulatory Visit: Payer: Self-pay

## 2020-10-26 ENCOUNTER — Other Ambulatory Visit: Payer: Self-pay

## 2020-11-22 ENCOUNTER — Other Ambulatory Visit: Payer: Self-pay

## 2020-11-22 MED FILL — Sildenafil Citrate Tab 50 MG: ORAL | 30 days supply | Qty: 6 | Fill #1 | Status: CN

## 2020-12-04 ENCOUNTER — Other Ambulatory Visit: Payer: Self-pay

## 2021-01-25 ENCOUNTER — Other Ambulatory Visit: Payer: Self-pay

## 2021-01-25 MED FILL — Sildenafil Citrate Tab 50 MG: ORAL | 30 days supply | Qty: 6 | Fill #1 | Status: AC

## 2021-02-05 ENCOUNTER — Other Ambulatory Visit: Payer: Self-pay

## 2021-03-01 ENCOUNTER — Other Ambulatory Visit: Payer: Self-pay

## 2021-03-01 MED FILL — Sildenafil Citrate Tab 50 MG: ORAL | 30 days supply | Qty: 6 | Fill #2 | Status: AC

## 2021-03-29 ENCOUNTER — Other Ambulatory Visit: Payer: Self-pay

## 2021-03-29 MED FILL — Sildenafil Citrate Tab 50 MG: ORAL | 30 days supply | Qty: 6 | Fill #3 | Status: CN

## 2021-04-15 ENCOUNTER — Other Ambulatory Visit: Payer: Self-pay

## 2021-04-15 DIAGNOSIS — Z1211 Encounter for screening for malignant neoplasm of colon: Secondary | ICD-10-CM | POA: Diagnosis not present

## 2021-04-15 DIAGNOSIS — I10 Essential (primary) hypertension: Secondary | ICD-10-CM | POA: Diagnosis not present

## 2021-04-15 DIAGNOSIS — K219 Gastro-esophageal reflux disease without esophagitis: Secondary | ICD-10-CM | POA: Diagnosis not present

## 2021-04-15 DIAGNOSIS — E7800 Pure hypercholesterolemia, unspecified: Secondary | ICD-10-CM | POA: Diagnosis not present

## 2021-04-15 DIAGNOSIS — Z125 Encounter for screening for malignant neoplasm of prostate: Secondary | ICD-10-CM | POA: Diagnosis not present

## 2021-04-15 DIAGNOSIS — Z6829 Body mass index (BMI) 29.0-29.9, adult: Secondary | ICD-10-CM | POA: Diagnosis not present

## 2021-04-15 MED ORDER — LOSARTAN POTASSIUM 100 MG PO TABS
ORAL_TABLET | ORAL | 2 refills | Status: DC
  Filled 2021-04-15: qty 90, 90d supply, fill #0
  Filled 2021-08-01: qty 90, 90d supply, fill #1
  Filled 2022-01-27: qty 90, 90d supply, fill #2

## 2021-04-16 ENCOUNTER — Other Ambulatory Visit: Payer: Self-pay

## 2021-04-16 MED FILL — Sildenafil Citrate Tab 50 MG: ORAL | 30 days supply | Qty: 6 | Fill #3 | Status: AC

## 2021-05-14 ENCOUNTER — Other Ambulatory Visit: Payer: Self-pay

## 2021-05-14 MED ORDER — AMLODIPINE BESYLATE 5 MG PO TABS
ORAL_TABLET | ORAL | 2 refills | Status: AC
  Filled 2021-05-14: qty 90, 90d supply, fill #0

## 2021-07-11 ENCOUNTER — Other Ambulatory Visit: Payer: Self-pay

## 2021-07-29 ENCOUNTER — Other Ambulatory Visit: Payer: Self-pay

## 2021-07-31 ENCOUNTER — Other Ambulatory Visit: Payer: Self-pay

## 2021-07-31 MED ORDER — SILDENAFIL CITRATE 50 MG PO TABS
ORAL_TABLET | ORAL | 11 refills | Status: DC
  Filled 2021-07-31: qty 6, 30d supply, fill #0
  Filled 2021-11-04: qty 6, 30d supply, fill #1
  Filled 2021-12-19: qty 6, 30d supply, fill #2
  Filled 2022-01-27: qty 6, 30d supply, fill #3
  Filled 2022-03-27: qty 6, 30d supply, fill #4
  Filled 2022-05-27: qty 6, 30d supply, fill #5

## 2021-08-01 ENCOUNTER — Other Ambulatory Visit: Payer: Self-pay

## 2021-11-04 ENCOUNTER — Other Ambulatory Visit: Payer: Self-pay

## 2021-11-05 ENCOUNTER — Other Ambulatory Visit: Payer: Self-pay

## 2021-11-08 ENCOUNTER — Other Ambulatory Visit: Payer: Self-pay

## 2021-11-08 MED ORDER — OMEPRAZOLE 40 MG PO CPDR
DELAYED_RELEASE_CAPSULE | ORAL | 2 refills | Status: AC
  Filled 2021-11-08: qty 90, 90d supply, fill #0
  Filled 2022-03-27: qty 90, 90d supply, fill #1

## 2021-12-19 ENCOUNTER — Other Ambulatory Visit: Payer: Self-pay

## 2021-12-20 ENCOUNTER — Other Ambulatory Visit: Payer: Self-pay

## 2022-01-27 ENCOUNTER — Other Ambulatory Visit: Payer: Self-pay

## 2022-03-28 ENCOUNTER — Other Ambulatory Visit: Payer: Self-pay

## 2022-03-28 DIAGNOSIS — K219 Gastro-esophageal reflux disease without esophagitis: Secondary | ICD-10-CM | POA: Diagnosis not present

## 2022-03-28 DIAGNOSIS — L03115 Cellulitis of right lower limb: Secondary | ICD-10-CM | POA: Diagnosis not present

## 2022-03-28 DIAGNOSIS — I10 Essential (primary) hypertension: Secondary | ICD-10-CM | POA: Diagnosis not present

## 2022-03-28 MED ORDER — DOXYCYCLINE HYCLATE 100 MG PO TABS
100.0000 mg | ORAL_TABLET | Freq: Two times a day (BID) | ORAL | 0 refills | Status: AC
  Filled 2022-03-28: qty 20, 10d supply, fill #0

## 2022-04-30 DIAGNOSIS — Z0000 Encounter for general adult medical examination without abnormal findings: Secondary | ICD-10-CM | POA: Diagnosis not present

## 2022-04-30 DIAGNOSIS — N529 Male erectile dysfunction, unspecified: Secondary | ICD-10-CM | POA: Diagnosis not present

## 2022-04-30 DIAGNOSIS — Z1211 Encounter for screening for malignant neoplasm of colon: Secondary | ICD-10-CM | POA: Diagnosis not present

## 2022-04-30 DIAGNOSIS — E7800 Pure hypercholesterolemia, unspecified: Secondary | ICD-10-CM | POA: Diagnosis not present

## 2022-04-30 DIAGNOSIS — Z1331 Encounter for screening for depression: Secondary | ICD-10-CM | POA: Diagnosis not present

## 2022-04-30 DIAGNOSIS — I10 Essential (primary) hypertension: Secondary | ICD-10-CM | POA: Diagnosis not present

## 2022-04-30 DIAGNOSIS — Z8249 Family history of ischemic heart disease and other diseases of the circulatory system: Secondary | ICD-10-CM | POA: Diagnosis not present

## 2022-05-01 ENCOUNTER — Other Ambulatory Visit: Payer: Self-pay

## 2022-05-01 DIAGNOSIS — I10 Essential (primary) hypertension: Secondary | ICD-10-CM

## 2022-05-07 ENCOUNTER — Ambulatory Visit (HOSPITAL_COMMUNITY): Payer: 59

## 2022-05-07 ENCOUNTER — Encounter (HOSPITAL_COMMUNITY): Payer: Self-pay

## 2022-05-27 ENCOUNTER — Other Ambulatory Visit: Payer: Self-pay

## 2022-05-27 MED ORDER — LOSARTAN POTASSIUM 100 MG PO TABS
100.0000 mg | ORAL_TABLET | Freq: Every day | ORAL | 2 refills | Status: DC
  Filled 2022-05-27: qty 90, 90d supply, fill #0
  Filled 2022-09-08: qty 90, 90d supply, fill #1
  Filled 2023-01-19: qty 90, 90d supply, fill #2

## 2022-08-29 ENCOUNTER — Other Ambulatory Visit: Payer: Self-pay

## 2022-09-01 ENCOUNTER — Other Ambulatory Visit: Payer: Self-pay

## 2022-09-01 MED ORDER — SILDENAFIL CITRATE 50 MG PO TABS
25.0000 mg | ORAL_TABLET | ORAL | 11 refills | Status: DC
  Filled 2022-09-01 – 2022-09-26 (×2): qty 6, 30d supply, fill #0
  Filled 2022-10-31: qty 6, 30d supply, fill #1
  Filled 2022-12-30 – 2023-01-19 (×2): qty 6, 30d supply, fill #2
  Filled 2023-03-04: qty 6, 30d supply, fill #3
  Filled 2023-04-22: qty 6, 30d supply, fill #4
  Filled 2023-05-29: qty 6, 30d supply, fill #5
  Filled 2023-08-14: qty 6, 30d supply, fill #6

## 2022-09-08 ENCOUNTER — Other Ambulatory Visit: Payer: Self-pay

## 2022-09-16 ENCOUNTER — Other Ambulatory Visit: Payer: Self-pay

## 2022-09-23 ENCOUNTER — Other Ambulatory Visit: Payer: Self-pay

## 2022-09-26 ENCOUNTER — Other Ambulatory Visit: Payer: Self-pay

## 2022-11-11 ENCOUNTER — Other Ambulatory Visit: Payer: Self-pay

## 2022-11-11 DIAGNOSIS — Z1211 Encounter for screening for malignant neoplasm of colon: Secondary | ICD-10-CM | POA: Diagnosis not present

## 2022-11-11 DIAGNOSIS — I10 Essential (primary) hypertension: Secondary | ICD-10-CM | POA: Diagnosis not present

## 2022-11-11 DIAGNOSIS — K219 Gastro-esophageal reflux disease without esophagitis: Secondary | ICD-10-CM | POA: Diagnosis not present

## 2022-11-11 DIAGNOSIS — E7800 Pure hypercholesterolemia, unspecified: Secondary | ICD-10-CM | POA: Diagnosis not present

## 2022-11-11 MED ORDER — LOSARTAN POTASSIUM 100 MG PO TABS
100.0000 mg | ORAL_TABLET | Freq: Every day | ORAL | 2 refills | Status: AC
  Filled 2022-11-11: qty 90, 90d supply, fill #0

## 2022-11-11 MED ORDER — AMLODIPINE BESYLATE 10 MG PO TABS
10.0000 mg | ORAL_TABLET | Freq: Every day | ORAL | 2 refills | Status: DC
  Filled 2022-11-11: qty 90, 90d supply, fill #0
  Filled 2023-04-22: qty 90, 90d supply, fill #1
  Filled 2023-08-14: qty 90, 90d supply, fill #2

## 2023-01-16 ENCOUNTER — Other Ambulatory Visit: Payer: Self-pay

## 2023-04-27 DIAGNOSIS — I10 Essential (primary) hypertension: Secondary | ICD-10-CM | POA: Diagnosis not present

## 2023-04-27 DIAGNOSIS — N529 Male erectile dysfunction, unspecified: Secondary | ICD-10-CM | POA: Diagnosis not present

## 2023-04-27 DIAGNOSIS — E7800 Pure hypercholesterolemia, unspecified: Secondary | ICD-10-CM | POA: Diagnosis not present

## 2023-04-27 DIAGNOSIS — Z0000 Encounter for general adult medical examination without abnormal findings: Secondary | ICD-10-CM | POA: Diagnosis not present

## 2023-05-29 ENCOUNTER — Other Ambulatory Visit: Payer: Self-pay

## 2023-05-29 MED ORDER — LOSARTAN POTASSIUM 100 MG PO TABS
100.0000 mg | ORAL_TABLET | Freq: Every day | ORAL | 2 refills | Status: DC
  Filled 2023-05-29: qty 90, 90d supply, fill #0
  Filled 2023-09-21: qty 90, 90d supply, fill #1
  Filled 2024-01-22: qty 90, 90d supply, fill #2

## 2023-09-21 ENCOUNTER — Other Ambulatory Visit: Payer: Self-pay

## 2023-09-21 MED ORDER — SILDENAFIL CITRATE 50 MG PO TABS
25.0000 mg | ORAL_TABLET | ORAL | 11 refills | Status: AC | PRN
  Filled 2023-09-21: qty 6, 30d supply, fill #0
  Filled 2023-10-27 – 2023-12-01 (×2): qty 6, 30d supply, fill #1
  Filled 2024-01-22: qty 6, 30d supply, fill #2
  Filled 2024-03-22: qty 6, 30d supply, fill #3
  Filled 2024-06-17: qty 6, 30d supply, fill #4

## 2023-10-27 ENCOUNTER — Other Ambulatory Visit: Payer: Self-pay

## 2023-10-27 DIAGNOSIS — I10 Essential (primary) hypertension: Secondary | ICD-10-CM | POA: Diagnosis not present

## 2023-10-27 DIAGNOSIS — M2550 Pain in unspecified joint: Secondary | ICD-10-CM | POA: Diagnosis not present

## 2023-10-27 DIAGNOSIS — E7800 Pure hypercholesterolemia, unspecified: Secondary | ICD-10-CM | POA: Diagnosis not present

## 2023-10-27 DIAGNOSIS — Z1211 Encounter for screening for malignant neoplasm of colon: Secondary | ICD-10-CM | POA: Diagnosis not present

## 2023-10-27 DIAGNOSIS — N529 Male erectile dysfunction, unspecified: Secondary | ICD-10-CM | POA: Diagnosis not present

## 2023-10-27 DIAGNOSIS — K219 Gastro-esophageal reflux disease without esophagitis: Secondary | ICD-10-CM | POA: Diagnosis not present

## 2023-10-27 DIAGNOSIS — Z1331 Encounter for screening for depression: Secondary | ICD-10-CM | POA: Diagnosis not present

## 2023-10-27 MED ORDER — AMLODIPINE BESYLATE 10 MG PO TABS
10.0000 mg | ORAL_TABLET | Freq: Every day | ORAL | 2 refills | Status: AC
  Filled 2023-10-27 – 2023-12-01 (×2): qty 90, 90d supply, fill #0

## 2023-10-27 MED ORDER — LOSARTAN POTASSIUM 100 MG PO TABS
100.0000 mg | ORAL_TABLET | Freq: Every day | ORAL | 2 refills | Status: AC
  Filled 2023-10-27: qty 90, 90d supply, fill #0

## 2023-11-09 ENCOUNTER — Other Ambulatory Visit: Payer: Self-pay

## 2023-12-01 ENCOUNTER — Other Ambulatory Visit: Payer: Self-pay

## 2024-03-22 ENCOUNTER — Other Ambulatory Visit: Payer: Self-pay

## 2024-04-25 ENCOUNTER — Other Ambulatory Visit: Payer: Self-pay

## 2024-04-25 DIAGNOSIS — E7800 Pure hypercholesterolemia, unspecified: Secondary | ICD-10-CM | POA: Diagnosis not present

## 2024-04-25 DIAGNOSIS — I10 Essential (primary) hypertension: Secondary | ICD-10-CM | POA: Diagnosis not present

## 2024-04-25 DIAGNOSIS — K219 Gastro-esophageal reflux disease without esophagitis: Secondary | ICD-10-CM | POA: Diagnosis not present

## 2024-04-25 DIAGNOSIS — Z125 Encounter for screening for malignant neoplasm of prostate: Secondary | ICD-10-CM | POA: Diagnosis not present

## 2024-04-25 DIAGNOSIS — N529 Male erectile dysfunction, unspecified: Secondary | ICD-10-CM | POA: Diagnosis not present

## 2024-04-25 MED ORDER — AMLODIPINE BESYLATE 10 MG PO TABS
10.0000 mg | ORAL_TABLET | Freq: Every day | ORAL | 2 refills | Status: AC
  Filled 2024-04-25: qty 90, 90d supply, fill #0

## 2024-04-25 MED ORDER — LOSARTAN POTASSIUM 100 MG PO TABS
100.0000 mg | ORAL_TABLET | Freq: Every day | ORAL | 2 refills | Status: AC
  Filled 2024-04-25: qty 90, 90d supply, fill #0

## 2024-05-05 ENCOUNTER — Other Ambulatory Visit: Payer: Self-pay

## 2024-06-17 ENCOUNTER — Other Ambulatory Visit: Payer: Self-pay

## 2024-06-17 MED ORDER — LOSARTAN POTASSIUM 100 MG PO TABS
100.0000 mg | ORAL_TABLET | Freq: Every day | ORAL | 1 refills | Status: AC
  Filled 2024-06-17: qty 90, 90d supply, fill #0

## 2024-06-21 ENCOUNTER — Other Ambulatory Visit: Payer: Self-pay
